# Patient Record
Sex: Male | Born: 2000 | Race: White | Hispanic: No | Marital: Single | Smoking: Never smoker
Health system: Southern US, Community
[De-identification: ages and names within clinical notes are randomized; demographics above are authoritative.]

## PROBLEM LIST (undated history)

## (undated) ENCOUNTER — Emergency Department (HOSPITAL_COMMUNITY): Admission: EM | Payer: Self-pay | Source: Home / Self Care

---
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
## 2019-06-29 DIAGNOSIS — S63633A Sprain of interphalangeal joint of left middle finger, initial encounter: Secondary | ICD-10-CM | POA: Diagnosis not present

## 2019-08-30 DIAGNOSIS — Z0000 Encounter for general adult medical examination without abnormal findings: Secondary | ICD-10-CM | POA: Diagnosis not present

## 2019-09-05 DIAGNOSIS — E7800 Pure hypercholesterolemia, unspecified: Secondary | ICD-10-CM | POA: Diagnosis not present

## 2019-09-05 DIAGNOSIS — Z113 Encounter for screening for infections with a predominantly sexual mode of transmission: Secondary | ICD-10-CM | POA: Diagnosis not present

## 2019-09-05 DIAGNOSIS — R300 Dysuria: Secondary | ICD-10-CM | POA: Diagnosis not present

## 2019-09-05 DIAGNOSIS — Z0000 Encounter for general adult medical examination without abnormal findings: Secondary | ICD-10-CM | POA: Diagnosis not present

## 2019-10-25 DIAGNOSIS — J020 Streptococcal pharyngitis: Secondary | ICD-10-CM | POA: Diagnosis not present

## 2019-12-24 DIAGNOSIS — Z03818 Encounter for observation for suspected exposure to other biological agents ruled out: Secondary | ICD-10-CM | POA: Diagnosis not present

## 2020-05-04 DIAGNOSIS — H6501 Acute serous otitis media, right ear: Secondary | ICD-10-CM | POA: Diagnosis not present

## 2020-05-12 DIAGNOSIS — Z03818 Encounter for observation for suspected exposure to other biological agents ruled out: Secondary | ICD-10-CM | POA: Diagnosis not present

## 2020-06-29 DIAGNOSIS — Z03818 Encounter for observation for suspected exposure to other biological agents ruled out: Secondary | ICD-10-CM | POA: Diagnosis not present

## 2020-07-22 DIAGNOSIS — Z03818 Encounter for observation for suspected exposure to other biological agents ruled out: Secondary | ICD-10-CM | POA: Diagnosis not present

## 2020-09-07 DIAGNOSIS — Z0000 Encounter for general adult medical examination without abnormal findings: Secondary | ICD-10-CM | POA: Diagnosis not present

## 2020-09-07 DIAGNOSIS — E7800 Pure hypercholesterolemia, unspecified: Secondary | ICD-10-CM | POA: Diagnosis not present

## 2020-09-15 DIAGNOSIS — Z0000 Encounter for general adult medical examination without abnormal findings: Secondary | ICD-10-CM | POA: Diagnosis not present

## 2020-10-06 DIAGNOSIS — Z03818 Encounter for observation for suspected exposure to other biological agents ruled out: Secondary | ICD-10-CM | POA: Diagnosis not present

## 2020-11-09 DIAGNOSIS — M9901 Segmental and somatic dysfunction of cervical region: Secondary | ICD-10-CM | POA: Diagnosis not present

## 2020-11-09 DIAGNOSIS — M9903 Segmental and somatic dysfunction of lumbar region: Secondary | ICD-10-CM | POA: Diagnosis not present

## 2020-11-09 DIAGNOSIS — M9905 Segmental and somatic dysfunction of pelvic region: Secondary | ICD-10-CM | POA: Diagnosis not present

## 2020-11-09 DIAGNOSIS — M9902 Segmental and somatic dysfunction of thoracic region: Secondary | ICD-10-CM | POA: Diagnosis not present

## 2020-11-26 DIAGNOSIS — M9905 Segmental and somatic dysfunction of pelvic region: Secondary | ICD-10-CM | POA: Diagnosis not present

## 2020-11-26 DIAGNOSIS — M9903 Segmental and somatic dysfunction of lumbar region: Secondary | ICD-10-CM | POA: Diagnosis not present

## 2020-11-26 DIAGNOSIS — M9902 Segmental and somatic dysfunction of thoracic region: Secondary | ICD-10-CM | POA: Diagnosis not present

## 2020-11-26 DIAGNOSIS — M9901 Segmental and somatic dysfunction of cervical region: Secondary | ICD-10-CM | POA: Diagnosis not present

## 2020-12-08 DIAGNOSIS — Z20822 Contact with and (suspected) exposure to covid-19: Secondary | ICD-10-CM | POA: Diagnosis not present

## 2020-12-08 DIAGNOSIS — Z03818 Encounter for observation for suspected exposure to other biological agents ruled out: Secondary | ICD-10-CM | POA: Diagnosis not present

## 2020-12-10 DIAGNOSIS — M9901 Segmental and somatic dysfunction of cervical region: Secondary | ICD-10-CM | POA: Diagnosis not present

## 2020-12-10 DIAGNOSIS — M9902 Segmental and somatic dysfunction of thoracic region: Secondary | ICD-10-CM | POA: Diagnosis not present

## 2020-12-10 DIAGNOSIS — M9903 Segmental and somatic dysfunction of lumbar region: Secondary | ICD-10-CM | POA: Diagnosis not present

## 2020-12-10 DIAGNOSIS — M9905 Segmental and somatic dysfunction of pelvic region: Secondary | ICD-10-CM | POA: Diagnosis not present

## 2020-12-24 DIAGNOSIS — M9903 Segmental and somatic dysfunction of lumbar region: Secondary | ICD-10-CM | POA: Diagnosis not present

## 2020-12-24 DIAGNOSIS — M9902 Segmental and somatic dysfunction of thoracic region: Secondary | ICD-10-CM | POA: Diagnosis not present

## 2020-12-24 DIAGNOSIS — M9901 Segmental and somatic dysfunction of cervical region: Secondary | ICD-10-CM | POA: Diagnosis not present

## 2020-12-24 DIAGNOSIS — M9905 Segmental and somatic dysfunction of pelvic region: Secondary | ICD-10-CM | POA: Diagnosis not present

## 2021-03-09 DIAGNOSIS — E7800 Pure hypercholesterolemia, unspecified: Secondary | ICD-10-CM | POA: Diagnosis not present

## 2021-05-10 DIAGNOSIS — E7800 Pure hypercholesterolemia, unspecified: Secondary | ICD-10-CM | POA: Diagnosis not present

## 2021-06-17 DIAGNOSIS — J358 Other chronic diseases of tonsils and adenoids: Secondary | ICD-10-CM | POA: Diagnosis not present

## 2021-09-21 DIAGNOSIS — Z0000 Encounter for general adult medical examination without abnormal findings: Secondary | ICD-10-CM | POA: Diagnosis not present

## 2021-11-01 DIAGNOSIS — M9904 Segmental and somatic dysfunction of sacral region: Secondary | ICD-10-CM | POA: Diagnosis not present

## 2021-11-01 DIAGNOSIS — M9903 Segmental and somatic dysfunction of lumbar region: Secondary | ICD-10-CM | POA: Diagnosis not present

## 2021-11-01 DIAGNOSIS — M9905 Segmental and somatic dysfunction of pelvic region: Secondary | ICD-10-CM | POA: Diagnosis not present

## 2021-11-01 DIAGNOSIS — M9902 Segmental and somatic dysfunction of thoracic region: Secondary | ICD-10-CM | POA: Diagnosis not present

## 2021-11-03 DIAGNOSIS — M9902 Segmental and somatic dysfunction of thoracic region: Secondary | ICD-10-CM | POA: Diagnosis not present

## 2021-11-03 DIAGNOSIS — M9904 Segmental and somatic dysfunction of sacral region: Secondary | ICD-10-CM | POA: Diagnosis not present

## 2021-11-03 DIAGNOSIS — M9903 Segmental and somatic dysfunction of lumbar region: Secondary | ICD-10-CM | POA: Diagnosis not present

## 2021-11-03 DIAGNOSIS — M9905 Segmental and somatic dysfunction of pelvic region: Secondary | ICD-10-CM | POA: Diagnosis not present

## 2021-11-10 DIAGNOSIS — M9902 Segmental and somatic dysfunction of thoracic region: Secondary | ICD-10-CM | POA: Diagnosis not present

## 2021-11-10 DIAGNOSIS — M9904 Segmental and somatic dysfunction of sacral region: Secondary | ICD-10-CM | POA: Diagnosis not present

## 2021-11-10 DIAGNOSIS — M9903 Segmental and somatic dysfunction of lumbar region: Secondary | ICD-10-CM | POA: Diagnosis not present

## 2021-11-10 DIAGNOSIS — M9905 Segmental and somatic dysfunction of pelvic region: Secondary | ICD-10-CM | POA: Diagnosis not present

## 2022-01-11 DIAGNOSIS — Z0000 Encounter for general adult medical examination without abnormal findings: Secondary | ICD-10-CM | POA: Diagnosis not present

## 2022-01-18 DIAGNOSIS — F411 Generalized anxiety disorder: Secondary | ICD-10-CM | POA: Diagnosis not present

## 2022-01-18 DIAGNOSIS — Z23 Encounter for immunization: Secondary | ICD-10-CM | POA: Diagnosis not present

## 2022-01-18 DIAGNOSIS — E7800 Pure hypercholesterolemia, unspecified: Secondary | ICD-10-CM | POA: Diagnosis not present

## 2022-01-18 DIAGNOSIS — Z0000 Encounter for general adult medical examination without abnormal findings: Secondary | ICD-10-CM | POA: Diagnosis not present

## 2022-01-18 DIAGNOSIS — J358 Other chronic diseases of tonsils and adenoids: Secondary | ICD-10-CM | POA: Diagnosis not present

## 2022-02-24 DIAGNOSIS — J039 Acute tonsillitis, unspecified: Secondary | ICD-10-CM | POA: Diagnosis not present

## 2022-07-06 DIAGNOSIS — M9904 Segmental and somatic dysfunction of sacral region: Secondary | ICD-10-CM | POA: Diagnosis not present

## 2022-07-06 DIAGNOSIS — M9905 Segmental and somatic dysfunction of pelvic region: Secondary | ICD-10-CM | POA: Diagnosis not present

## 2022-07-06 DIAGNOSIS — M9903 Segmental and somatic dysfunction of lumbar region: Secondary | ICD-10-CM | POA: Diagnosis not present

## 2022-07-06 DIAGNOSIS — S39012A Strain of muscle, fascia and tendon of lower back, initial encounter: Secondary | ICD-10-CM | POA: Diagnosis not present

## 2022-07-28 DIAGNOSIS — M9904 Segmental and somatic dysfunction of sacral region: Secondary | ICD-10-CM | POA: Diagnosis not present

## 2022-07-28 DIAGNOSIS — S39012A Strain of muscle, fascia and tendon of lower back, initial encounter: Secondary | ICD-10-CM | POA: Diagnosis not present

## 2022-07-28 DIAGNOSIS — M9905 Segmental and somatic dysfunction of pelvic region: Secondary | ICD-10-CM | POA: Diagnosis not present

## 2022-07-28 DIAGNOSIS — M9903 Segmental and somatic dysfunction of lumbar region: Secondary | ICD-10-CM | POA: Diagnosis not present

## 2022-08-25 DIAGNOSIS — S39012A Strain of muscle, fascia and tendon of lower back, initial encounter: Secondary | ICD-10-CM | POA: Diagnosis not present

## 2022-08-25 DIAGNOSIS — M9903 Segmental and somatic dysfunction of lumbar region: Secondary | ICD-10-CM | POA: Diagnosis not present

## 2022-08-25 DIAGNOSIS — M9905 Segmental and somatic dysfunction of pelvic region: Secondary | ICD-10-CM | POA: Diagnosis not present

## 2022-08-25 DIAGNOSIS — M9904 Segmental and somatic dysfunction of sacral region: Secondary | ICD-10-CM | POA: Diagnosis not present

## 2022-09-22 DIAGNOSIS — M9904 Segmental and somatic dysfunction of sacral region: Secondary | ICD-10-CM | POA: Diagnosis not present

## 2022-09-22 DIAGNOSIS — M9903 Segmental and somatic dysfunction of lumbar region: Secondary | ICD-10-CM | POA: Diagnosis not present

## 2022-09-22 DIAGNOSIS — M9905 Segmental and somatic dysfunction of pelvic region: Secondary | ICD-10-CM | POA: Diagnosis not present

## 2022-09-22 DIAGNOSIS — S39012A Strain of muscle, fascia and tendon of lower back, initial encounter: Secondary | ICD-10-CM | POA: Diagnosis not present

## 2022-09-27 DIAGNOSIS — H31091 Other chorioretinal scars, right eye: Secondary | ICD-10-CM | POA: Diagnosis not present

## 2022-09-27 DIAGNOSIS — H5213 Myopia, bilateral: Secondary | ICD-10-CM | POA: Diagnosis not present

## 2022-10-20 DIAGNOSIS — Z113 Encounter for screening for infections with a predominantly sexual mode of transmission: Secondary | ICD-10-CM | POA: Diagnosis not present

## 2022-10-20 DIAGNOSIS — E7800 Pure hypercholesterolemia, unspecified: Secondary | ICD-10-CM | POA: Diagnosis not present

## 2024-05-12 DIAGNOSIS — M25571 Pain in right ankle and joints of right foot: Secondary | ICD-10-CM | POA: Diagnosis not present

## 2024-05-12 DIAGNOSIS — S93491A Sprain of other ligament of right ankle, initial encounter: Secondary | ICD-10-CM | POA: Diagnosis not present
